# Patient Record
Sex: Female | Born: 1990
Health system: Southern US, Community
[De-identification: ages and names within clinical notes are randomized; demographics above are authoritative.]

## PROBLEM LIST (undated history)

## (undated) DIAGNOSIS — N200 Calculus of kidney: Secondary | ICD-10-CM

## (undated) DIAGNOSIS — N946 Dysmenorrhea, unspecified: Secondary | ICD-10-CM

## (undated) DIAGNOSIS — N920 Excessive and frequent menstruation with regular cycle: Secondary | ICD-10-CM

## (undated) DIAGNOSIS — F419 Anxiety disorder, unspecified: Secondary | ICD-10-CM

## (undated) DIAGNOSIS — F32A Depression, unspecified: Secondary | ICD-10-CM

## (undated) DIAGNOSIS — F329 Major depressive disorder, single episode, unspecified: Secondary | ICD-10-CM

## (undated) HISTORY — DX: Depression, unspecified: F32.A

## (undated) HISTORY — DX: Major depressive disorder, single episode, unspecified: F32.9

## (undated) HISTORY — DX: Dysmenorrhea, unspecified: N94.6

## (undated) HISTORY — DX: Excessive and frequent menstruation with regular cycle: N92.0

## (undated) HISTORY — DX: Anxiety disorder, unspecified: F41.9

## (undated) HISTORY — DX: Calculus of kidney: N20.0

---
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
## 2010-01-15 HISTORY — PX: TONSILLECTOMY AND ADENOIDECTOMY: SHX28

## 2011-01-05 ENCOUNTER — Ambulatory Visit: Payer: Self-pay | Admitting: Internal Medicine

## 2012-03-29 ENCOUNTER — Ambulatory Visit: Payer: Self-pay | Admitting: Emergency Medicine

## 2013-07-10 LAB — HM PAP SMEAR: HM Pap smear: NEGATIVE

## 2013-08-18 ENCOUNTER — Ambulatory Visit: Payer: Self-pay | Admitting: Oncology

## 2013-09-15 ENCOUNTER — Ambulatory Visit: Payer: Self-pay | Admitting: Oncology

## 2014-03-18 ENCOUNTER — Ambulatory Visit: Payer: Self-pay | Admitting: Internal Medicine

## 2014-03-21 ENCOUNTER — Emergency Department: Payer: Self-pay | Admitting: Emergency Medicine

## 2014-06-17 ENCOUNTER — Telehealth: Payer: Self-pay | Admitting: Obstetrics and Gynecology

## 2014-06-17 ENCOUNTER — Other Ambulatory Visit: Payer: Self-pay | Admitting: Obstetrics and Gynecology

## 2014-06-17 MED ORDER — ETONOGESTREL-ETHINYL ESTRADIOL 0.12-0.015 MG/24HR VA RING: VAGINAL_RING | VAGINAL | Status: DC

## 2014-06-17 NOTE — Telephone Encounter (Signed)
Pt called and would like a reffil on her RX..she needs a refill on her BC nuva ring..she was given a sample and liked and was told to call in if she wanted a refill..she uses the [facility] [place]..number [phone]...thansk tina

## 2014-06-17 NOTE — Telephone Encounter (Signed)
Done-ac 

## 2014-06-17 NOTE — Telephone Encounter (Signed)
Please let her know a prescription has been sent in, may want to check online for discount coupon for the Nuvaring

## 2014-06-21 ENCOUNTER — Telehealth: Payer: Self-pay | Admitting: Obstetrics and Gynecology

## 2014-06-22 ENCOUNTER — Other Ambulatory Visit: Payer: Self-pay | Admitting: Obstetrics and Gynecology

## 2014-06-22 NOTE — Telephone Encounter (Signed)
Please let her know a years rx was sent in

## 2014-06-22 NOTE — Telephone Encounter (Signed)
Called pt and notified that rx was sent to her pharmacy

## 2014-06-24 MED ORDER — ETONOGESTREL-ETHINYL ESTRADIOL 0.12-0.015 MG/24HR VA RING: VAGINAL_RING | VAGINAL | Status: DC

## 2014-06-24 NOTE — Telephone Encounter (Signed)
Pt called and stated Nuvaring was not at pharmacy.  Advised pt we will resend RX to pharmacy. RX resent to [employer] [facility]

## 2014-07-12 ENCOUNTER — Encounter: Payer: Self-pay | Admitting: *Deleted

## 2014-07-16 ENCOUNTER — Ambulatory Visit (INDEPENDENT_AMBULATORY_CARE_PROVIDER_SITE_OTHER): Payer: BC Managed Care – PPO | Admitting: Obstetrics and Gynecology

## 2014-07-16 ENCOUNTER — Encounter: Payer: Self-pay | Admitting: Obstetrics and Gynecology

## 2014-07-16 VITALS — BP 73/51 | HR 67 | Ht 66.0 in | Wt 191.4 lb

## 2014-07-16 DIAGNOSIS — R5383 Other fatigue: Secondary | ICD-10-CM

## 2014-07-16 DIAGNOSIS — F32A Depression, unspecified: Secondary | ICD-10-CM

## 2014-07-16 DIAGNOSIS — F418 Other specified anxiety disorders: Secondary | ICD-10-CM

## 2014-07-16 DIAGNOSIS — Z832 Family history of diseases of the blood and blood-forming organs and certain disorders involving the immune mechanism: Secondary | ICD-10-CM

## 2014-07-16 DIAGNOSIS — Z9889 Other specified postprocedural states: Secondary | ICD-10-CM | POA: Diagnosis not present

## 2014-07-16 DIAGNOSIS — F419 Anxiety disorder, unspecified: Principal | ICD-10-CM

## 2014-07-16 DIAGNOSIS — F329 Major depressive disorder, single episode, unspecified: Secondary | ICD-10-CM | POA: Diagnosis not present

## 2014-07-16 MED ORDER — BUPROPION HCL ER (XL) 150 MG PO TB24: 150.0000 mg | ORAL_TABLET | Freq: Every day | ORAL | Status: DC

## 2014-07-16 NOTE — Progress Notes (Signed)
Subjective:     Patient ID: Hinda Glatterassie Victoria Vinluan, female   DOB: 05/22/1990, 24 y.o.   MRN: [id number]  HPI previously seen for contraceptive management and anxiety- here for f/u on new medications  Review of Systems Report doing well on Nuvaring and desires to continue use Reports feeling less anxious and no panic attacks since starting Zoloft, but feeling overly tired and sleeping all day, feels too melancholy and not happy with medication.    Objective:   Physical Exam A&O X 4; Drowsy Pelvic exam deferred    Assessment:     Anxiety Contraceptive survellience  Fatigue No diagnosis of factor V mutation in father Plan:     Stop zoloft, and change to wellbutrin 150mg  daily, recheck in 5 weeks Labs obtained to r/o vitamin deficiencies and factor V mutation.  Will call with results.  RTC in 5 weeks  Ethridge Sollenberger Ines BloomerBurr, [place]

## 2014-07-17 LAB — COMPREHENSIVE METABOLIC PANEL
ALK PHOS: 38 IU/L — AB (ref 39–117)
ALT: 34 IU/L — ABNORMAL HIGH (ref 0–32)
AST: 30 IU/L (ref 0–40)
Albumin/Globulin Ratio: 2.3 (ref 1.1–2.5)
Albumin: 4.4 g/dL (ref 3.5–5.5)
BUN/Creatinine Ratio: 15 (ref 8–20)
BUN: 10 mg/dL (ref 6–20)
Bilirubin Total: 0.4 mg/dL (ref 0.0–1.2)
CHLORIDE: 103 mmol/L (ref 97–108)
CO2: 20 mmol/L (ref 18–29)
Calcium: 9.4 mg/dL (ref 8.7–10.2)
Creatinine, Ser: 0.68 mg/dL (ref 0.57–1.00)
GFR calc Af Amer: 142 mL/min/{1.73_m2} (ref >59)
GFR calc non Af Amer: 123 mL/min/{1.73_m2} (ref >59)
Globulin, Total: 1.9 g/dL (ref 1.5–4.5)
Glucose: 86 mg/dL (ref 65–99)
POTASSIUM: 4.5 mmol/L (ref 3.5–5.2)
SODIUM: 139 mmol/L (ref 134–144)
TOTAL PROTEIN: 6.3 g/dL (ref 6.0–8.5)

## 2014-07-17 LAB — THYROID PANEL WITH TSH
Free Thyroxine Index: 2.8 (ref 1.2–4.9)
T3 Uptake Ratio: 25 % (ref 24–39)
T4 TOTAL: 11 ug/dL (ref 4.5–12.0)
TSH: 1.89 u[IU]/mL (ref 0.450–4.500)

## 2014-07-17 LAB — CBC
HEMOGLOBIN: 14.3 g/dL (ref 11.1–15.9)
Hematocrit: 43 % (ref 34.0–46.6)
MCH: 34.3 pg — ABNORMAL HIGH (ref 26.6–33.0)
MCHC: 33.3 g/dL (ref 31.5–35.7)
MCV: 103 fL — AB (ref 79–97)
PLATELETS: 234 10*3/uL (ref 150–379)
RBC: 4.17 x10E6/uL (ref 3.77–5.28)
RDW: 12.4 % (ref 12.3–15.4)
WBC: 5.3 10*3/uL (ref 3.4–10.8)

## 2014-07-17 LAB — VITAMIN B12: VITAMIN B 12: 249 pg/mL (ref 211–946)

## 2014-07-17 LAB — FERRITIN: FERRITIN: 53 ng/mL (ref 15–150)

## 2014-07-22 LAB — FACTOR 5 LEIDEN

## 2014-07-22 LAB — VITAMIN D 1,25 DIHYDROXY
VITAMIN D 1, 25 (OH) TOTAL: 66 pg/mL
Vitamin D3 1, 25 (OH)2: 66 pg/mL

## 2014-08-13 ENCOUNTER — Other Ambulatory Visit: Payer: Self-pay | Admitting: *Deleted

## 2014-08-13 ENCOUNTER — Telehealth: Payer: Self-pay | Admitting: Obstetrics and Gynecology

## 2014-08-13 MED ORDER — BUPROPION HCL ER (XL) 150 MG PO TB24: 150.0000 mg | ORAL_TABLET | Freq: Every day | ORAL | Status: DC

## 2014-08-13 NOTE — Telephone Encounter (Signed)
Medication refilled pt has reck appt 08/20/2014

## 2014-08-13 NOTE — Telephone Encounter (Signed)
Tiffany Wilkerson has a f/u on 8/5 and is going to run out of med before she comes. Can you just sent 3 pills until then or what do you want her to do since that's what the appt is for.

## 2014-08-20 ENCOUNTER — Ambulatory Visit: Payer: BC Managed Care – PPO | Admitting: Obstetrics and Gynecology

## 2014-09-03 ENCOUNTER — Encounter: Payer: Self-pay | Admitting: Obstetrics and Gynecology

## 2014-09-03 ENCOUNTER — Ambulatory Visit (INDEPENDENT_AMBULATORY_CARE_PROVIDER_SITE_OTHER): Payer: BC Managed Care – PPO | Admitting: Obstetrics and Gynecology

## 2014-09-03 VITALS — BP 114/83 | HR 81 | Ht 66.0 in | Wt 187.0 lb

## 2014-09-03 DIAGNOSIS — F32A Depression, unspecified: Secondary | ICD-10-CM

## 2014-09-03 DIAGNOSIS — F329 Major depressive disorder, single episode, unspecified: Secondary | ICD-10-CM | POA: Diagnosis not present

## 2014-09-03 NOTE — Progress Notes (Signed)
Subjective:     Patient ID: Tiffany Wilkerson, female   DOB: 1990-09-23, 24 y.o.   MRN: [id number]  HPI Here for SNRI follow-up.  Review of Systems States '180%' improvement in symptoms, and has more energy. Happy with switch to Wellbutrin.  Does report feeling frustrated with inability to stay focused or complete task; boyfriend says she constantly spaces out or seems distracted. She states she has always felt that way but now that she is feeling better it is more noticeable.    Objective:   Physical Exam A&O x4 Well groomed female in no distress.     Assessment:     Depression, under good control with SNRI Possible ADD     Plan:     To continue on wellbutrin at current dose. Refer to [facility] for evaluation of ADD, will f/u after that apppointment to see if treatment needed.  Asencion Loveday Ines Bloomer, CNM

## 2014-09-03 NOTE — Patient Instructions (Signed)

## 2014-09-08 ENCOUNTER — Ambulatory Visit: Payer: BC Managed Care – PPO | Admitting: Obstetrics and Gynecology

## 2014-10-26 ENCOUNTER — Encounter: Payer: Self-pay | Admitting: Obstetrics and Gynecology

## 2014-10-26 ENCOUNTER — Other Ambulatory Visit: Payer: BC Managed Care – PPO

## 2014-10-26 ENCOUNTER — Other Ambulatory Visit: Payer: Self-pay | Admitting: Obstetrics and Gynecology

## 2014-10-27 LAB — BETA HCG QUANT (REF LAB)

## 2014-11-08 ENCOUNTER — Encounter: Payer: Self-pay | Admitting: Obstetrics and Gynecology

## 2014-11-19 ENCOUNTER — Encounter: Payer: Self-pay | Admitting: Obstetrics and Gynecology

## 2014-12-17 ENCOUNTER — Other Ambulatory Visit: Payer: Self-pay | Admitting: Obstetrics and Gynecology

## 2015-01-17 ENCOUNTER — Other Ambulatory Visit: Payer: Self-pay | Admitting: Obstetrics and Gynecology

## 2015-02-14 ENCOUNTER — Other Ambulatory Visit: Payer: Self-pay | Admitting: Obstetrics and Gynecology

## 2015-05-04 ENCOUNTER — Encounter: Payer: Self-pay | Admitting: Obstetrics and Gynecology

## 2015-05-05 ENCOUNTER — Encounter: Payer: Self-pay | Admitting: Obstetrics and Gynecology

## 2015-06-06 ENCOUNTER — Encounter: Payer: Self-pay | Admitting: Obstetrics and Gynecology

## 2015-06-16 ENCOUNTER — Other Ambulatory Visit: Payer: Self-pay | Admitting: Obstetrics and Gynecology

## 2015-06-24 IMAGING — CT CT ABD-PELV W/ CM
2 of 4 series · 16 of 46 positions shown, 18 images · IV contrast (omnipaque)
Comparison: None.

CLINICAL DATA: Initial evaluation abdominal pain for 2 weeks,
personal history of gastric bypass on August 2013

EXAM:
CT ABDOMEN AND PELVIS WITH CONTRAST
TECHNIQUE: Multidetector CT imaging of the abdomen and pelvis was performed
using the standard protocol following bolus administration of
intravenous contrast.
CONTRAST:  100 mL Omnipaque 300

[Series 2: routine abd pel with · axial · 0.67mm/px · z∈[-498,-68]mm · 13 of 96 slices shown, 15 images]
[im 5/96  soft-tissue]
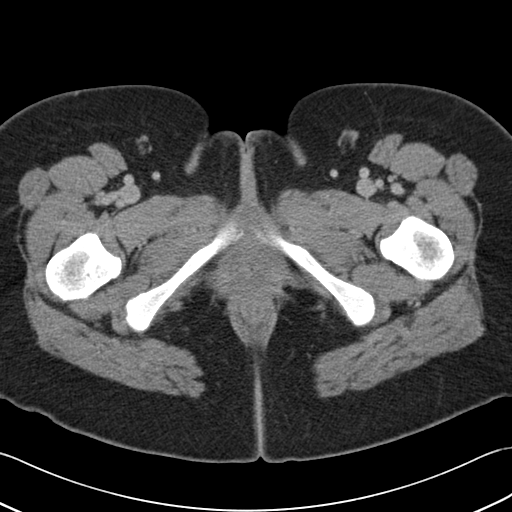
[im 5/96  bone]
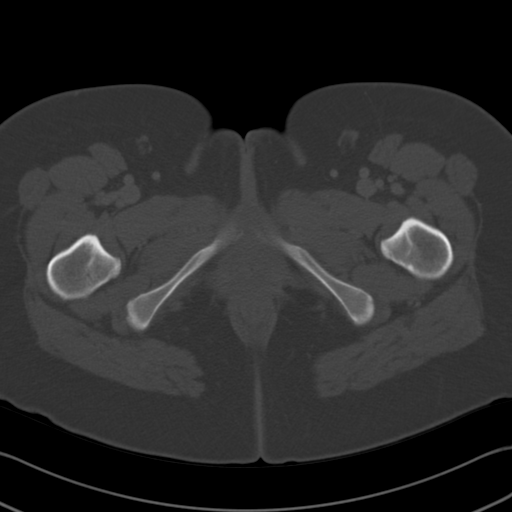
[im 13/96  soft-tissue]
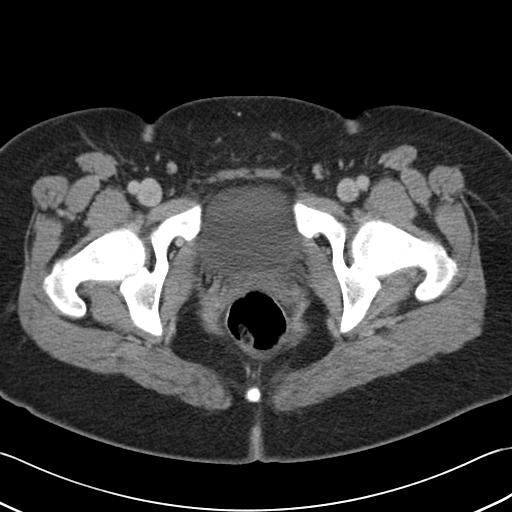
[im 21/96  soft-tissue]
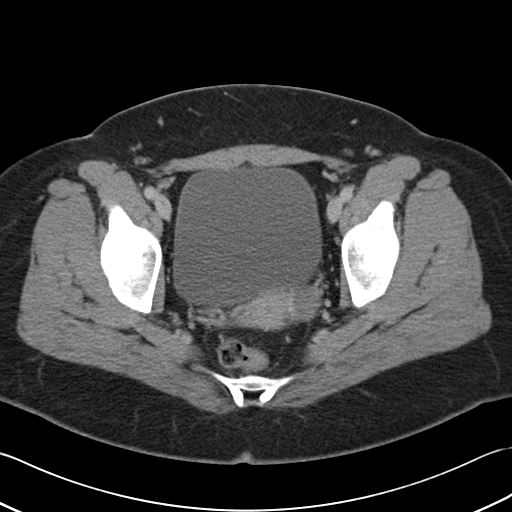
[im 25/96  soft-tissue]
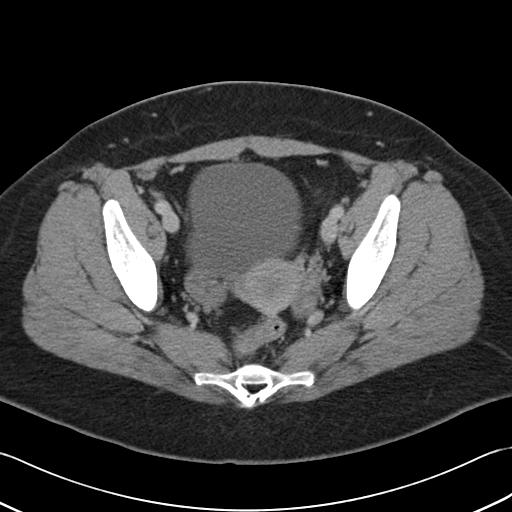
[im 34/96  soft-tissue]
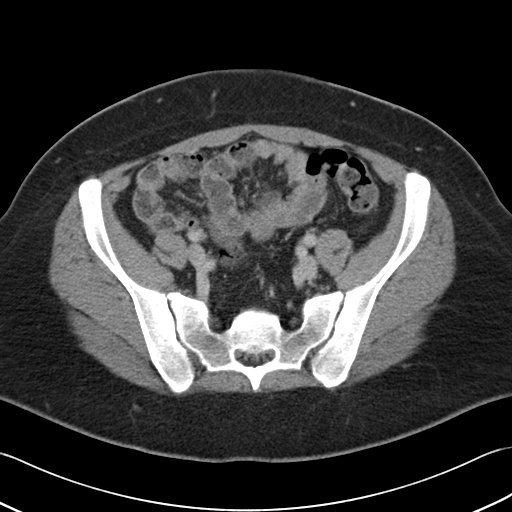
[im 42/96  soft-tissue]
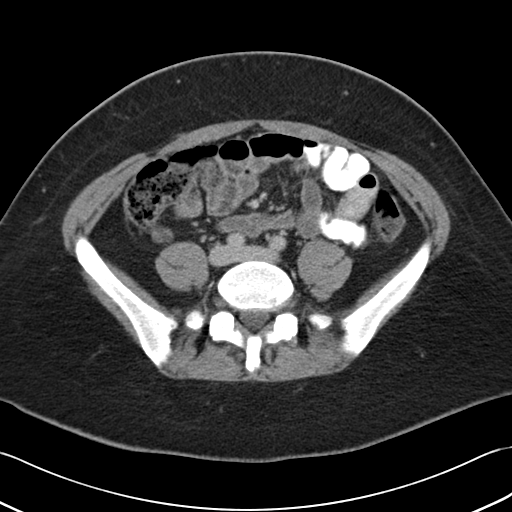
[im 50/96  soft-tissue]
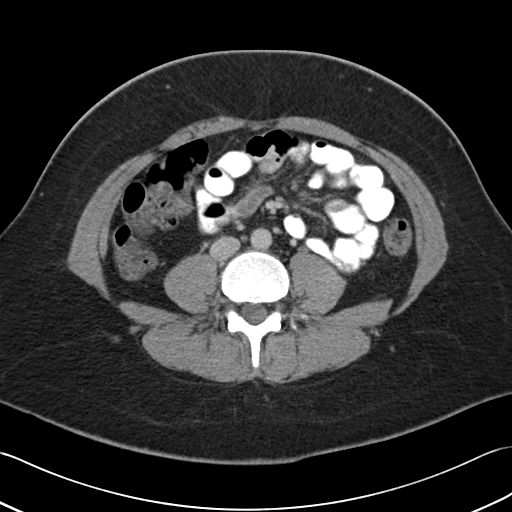
[im 54/96  soft-tissue]
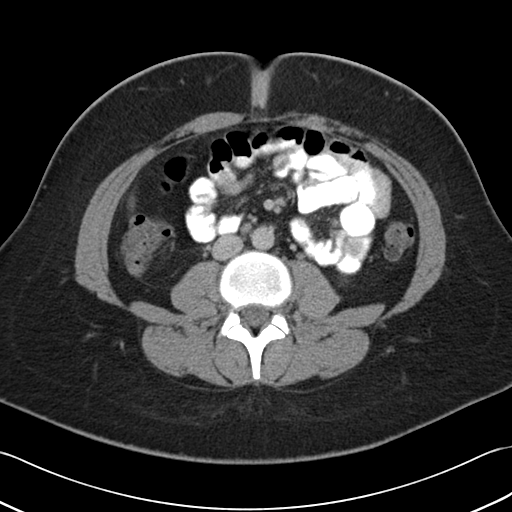
[im 62/96  soft-tissue]
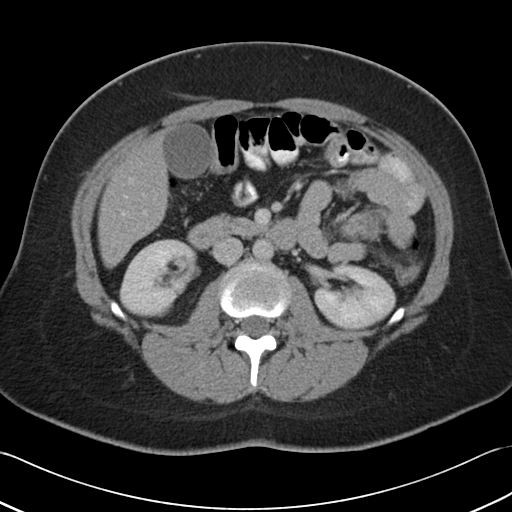
[im 62/96  bone]
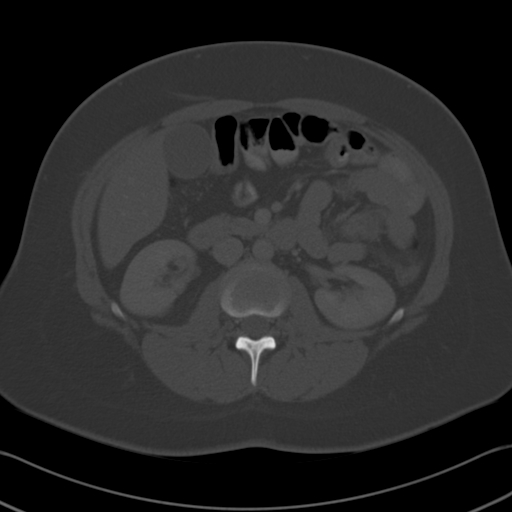
[im 71/96  soft-tissue]
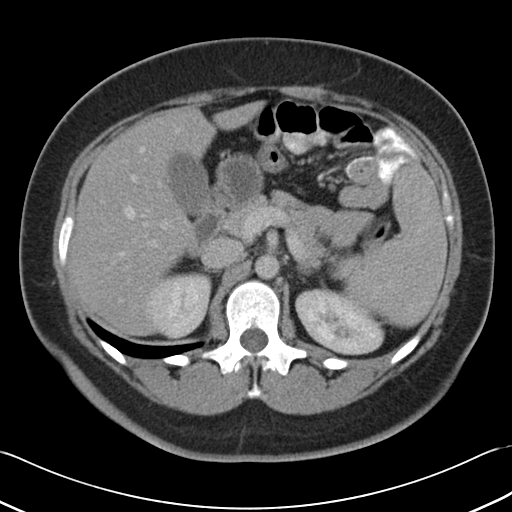
[im 75/96  soft-tissue]
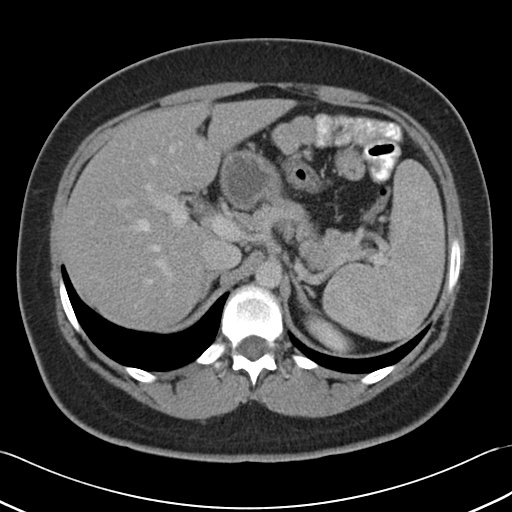
[im 83/96  soft-tissue]
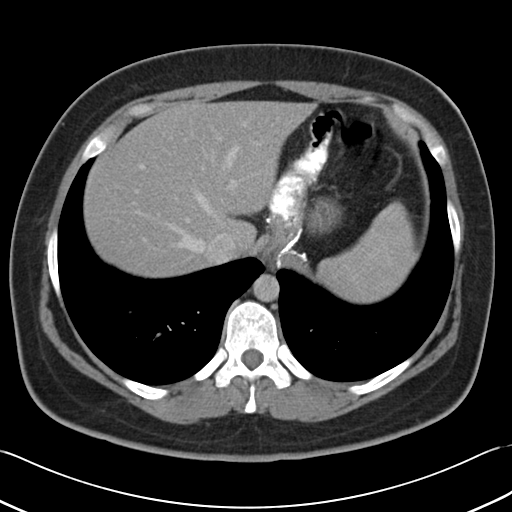
[im 91/96  soft-tissue]
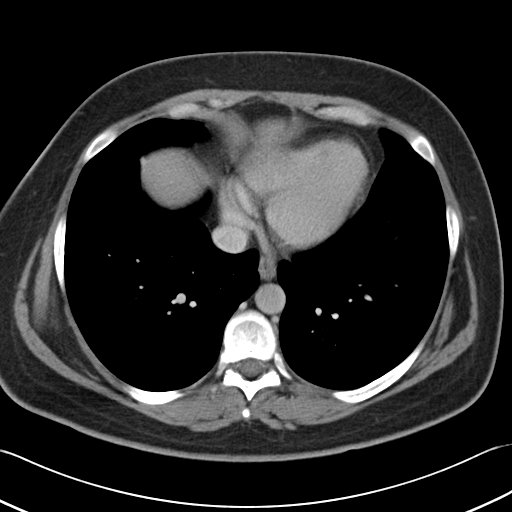

[Series 5: cor routine abd pel with · coronal · 0.62mm/px · 3 of 131 slices shown]
[im 44/131  soft-tissue]
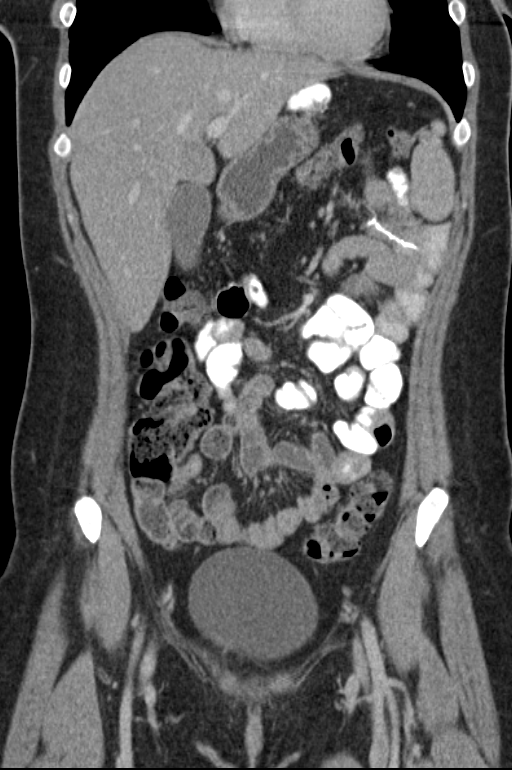
[im 58/131  soft-tissue]
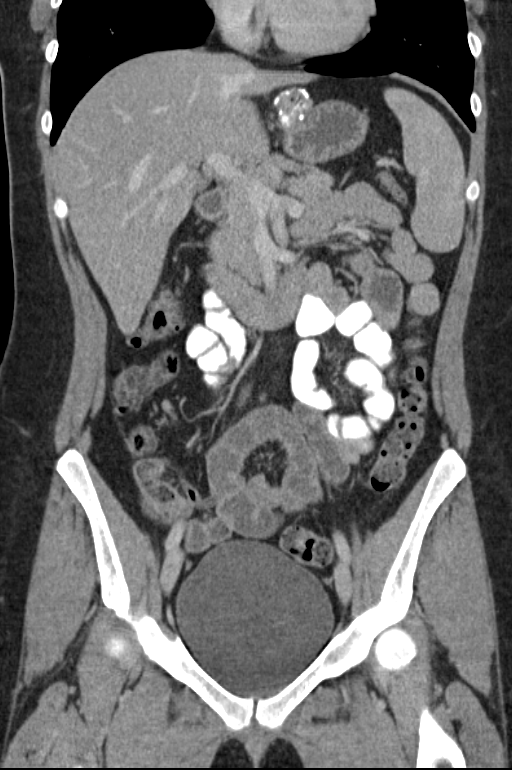
[im 73/131  soft-tissue]
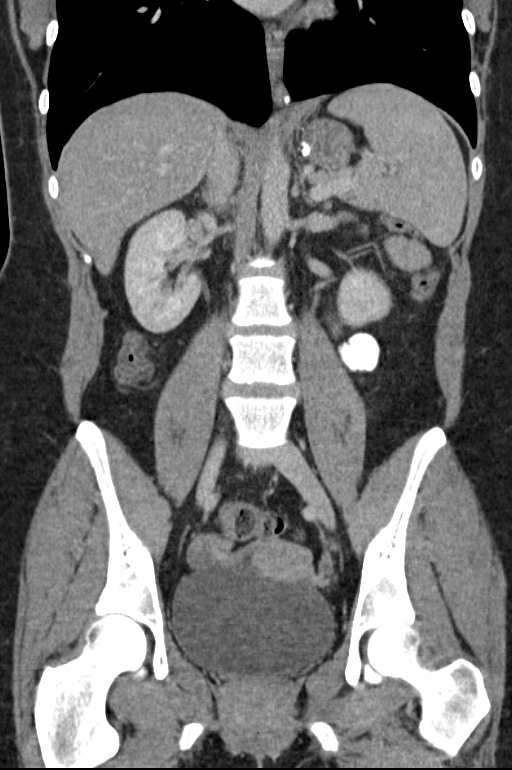

[16 of 46 positions shown; findings below may reference images not displayed]

FINDINGS: Mild to moderate hepatic steatosis. Spleen is normal. Pancreas is
normal. Adrenal glands are normal.

Surgical change consistent with gastric bypass.

Kidneys are normal. Small bowel is normal. Appendix is normal. Large
bowel is normal.

Bladder is normal. Reproductive organs are normal. There is no free
fluid. There is no significant adenopathy in the abdomen or pelvis.
Abdominal aorta is normal.

Laterally in the right lower lobe there are 2 pulmonary nodules both
measuring about 3 mm. Posteriorly in the left lung base there is a 3
mm pulmonary nodule.

There are no acute musculoskeletal findings.
IMPRESSION: No acute abnormalities to explain the patient's symptoms.

Pulmonary nodules seen incidentally. If the patient is at high risk
for bronchogenic carcinoma, follow-up chest CT at 1 year is
recommended. If the patient is at low risk, no follow-up is needed.
This recommendation follows the consensus statement: Guidelines for
Management of Small Pulmonary Nodules Detected on CT Scans: A
Statement from the [HOSPITAL] as published in Radiology

## 2015-08-15 ENCOUNTER — Other Ambulatory Visit: Payer: Self-pay | Admitting: *Deleted

## 2015-08-15 DIAGNOSIS — R5383 Other fatigue: Secondary | ICD-10-CM

## 2015-08-16 ENCOUNTER — Other Ambulatory Visit: Payer: Self-pay

## 2015-08-16 ENCOUNTER — Other Ambulatory Visit: Payer: BC Managed Care – PPO

## 2015-08-16 DIAGNOSIS — R5383 Other fatigue: Secondary | ICD-10-CM

## 2015-08-18 LAB — COMPREHENSIVE METABOLIC PANEL
A/G RATIO: 2.1 (ref 1.2–2.2)
ALT: 19 IU/L (ref 0–32)
AST: 11 IU/L (ref 0–40)
Albumin: 4 g/dL (ref 3.5–5.5)
Alkaline Phosphatase: 33 IU/L — ABNORMAL LOW (ref 39–117)
BUN/Creatinine Ratio: 16 (ref 9–23)
BUN: 9 mg/dL (ref 6–20)
Bilirubin Total: 0.3 mg/dL (ref 0.0–1.2)
CALCIUM: 8.8 mg/dL (ref 8.7–10.2)
CO2: 24 mmol/L (ref 18–29)
CREATININE: 0.58 mg/dL (ref 0.57–1.00)
Chloride: 103 mmol/L (ref 96–106)
GFR, EST AFRICAN AMERICAN: 148 mL/min/{1.73_m2} (ref >59)
GFR, EST NON AFRICAN AMERICAN: 129 mL/min/{1.73_m2} (ref >59)
GLUCOSE: 63 mg/dL — AB (ref 65–99)
Globulin, Total: 1.9 g/dL (ref 1.5–4.5)
Potassium: 4.8 mmol/L (ref 3.5–5.2)
Sodium: 141 mmol/L (ref 134–144)
TOTAL PROTEIN: 5.9 g/dL — AB (ref 6.0–8.5)

## 2015-08-18 LAB — HEMOGLOBIN A1C
ESTIMATED AVERAGE GLUCOSE: 91 mg/dL
Hgb A1c MFr Bld: 4.8 % (ref 4.8–5.6)

## 2015-08-18 LAB — CBC
HEMATOCRIT: 39.1 % (ref 34.0–46.6)
HEMOGLOBIN: 12.6 g/dL (ref 11.1–15.9)
MCH: 27.8 pg (ref 26.6–33.0)
MCHC: 32.2 g/dL (ref 31.5–35.7)
MCV: 86 fL (ref 79–97)
PLATELETS: 271 10*3/uL (ref 150–379)
RBC: 4.53 x10E6/uL (ref 3.77–5.28)
RDW: 14.6 % (ref 12.3–15.4)
WBC: 6.9 10*3/uL (ref 3.4–10.8)

## 2015-08-18 LAB — IRON: IRON: 117 ug/dL (ref 27–159)

## 2015-08-18 LAB — THYROID PANEL WITH TSH
FREE THYROXINE INDEX: 0.9 — AB (ref 1.2–4.9)
T3 UPTAKE RATIO: 19 % — AB (ref 24–39)
T4, Total: 4.8 ug/dL (ref 4.5–12.0)
TSH: 1.85 u[IU]/mL (ref 0.450–4.500)

## 2015-08-18 LAB — VITAMIN D 25 HYDROXY (VIT D DEFICIENCY, FRACTURES): VIT D 25 HYDROXY: 9.8 ng/mL — AB (ref 30.0–100.0)

## 2015-08-22 ENCOUNTER — Other Ambulatory Visit: Payer: Self-pay | Admitting: Obstetrics and Gynecology

## 2015-08-22 ENCOUNTER — Telehealth: Payer: Self-pay | Admitting: *Deleted

## 2015-08-22 DIAGNOSIS — E559 Vitamin D deficiency, unspecified: Secondary | ICD-10-CM | POA: Insufficient documentation

## 2015-08-22 MED ORDER — VITAMIN D (ERGOCALCIFEROL) 1.25 MG (50000 UNIT) PO CAPS: 50000.0000 [IU] | ORAL_CAPSULE | ORAL | 2 refills | Status: AC

## 2015-08-22 NOTE — Telephone Encounter (Signed)
Notified pt and mailed info on Vit D

## 2015-08-25 ENCOUNTER — Ambulatory Visit: Payer: BC Managed Care – PPO | Admitting: Obstetrics and Gynecology

## 2015-09-22 ENCOUNTER — Encounter: Payer: BC Managed Care – PPO | Admitting: Obstetrics and Gynecology

## 2015-10-13 ENCOUNTER — Telehealth: Payer: Self-pay | Admitting: Obstetrics and Gynecology

## 2015-10-13 NOTE — Telephone Encounter (Signed)
Left detailed message for pt 

## 2015-10-13 NOTE — Telephone Encounter (Signed)
Issues with ins and medications got all screwed up, [facility] whole [facility], and she cant get her nuva ring until she gets her ins reinstated. Do we have a sample.

## 2015-10-21 MED FILL — DEXTROAMP-AMPHET ER 25 MG C: 25 | 30 days supply | Qty: 60 | Fill #0

## 2015-10-26 MED FILL — ADZENYS XR-ODT 15.7 MG TAB: 15.7 | 30 days supply | Qty: 60 | Fill #0

## 2016-01-15 DIAGNOSIS — F902 Attention-deficit hyperactivity disorder, combined type: Secondary | ICD-10-CM | POA: Insufficient documentation

## 2016-01-15 DIAGNOSIS — F419 Anxiety disorder, unspecified: Secondary | ICD-10-CM | POA: Insufficient documentation

## 2016-02-16 ENCOUNTER — Encounter: Payer: BC Managed Care – PPO | Admitting: Obstetrics and Gynecology

## 2017-05-09 ENCOUNTER — Encounter: Payer: BC Managed Care – PPO | Admitting: Obstetrics and Gynecology

## 2017-07-12 ENCOUNTER — Encounter: Payer: BC Managed Care – PPO | Admitting: Obstetrics and Gynecology

## 2017-08-05 ENCOUNTER — Encounter: Payer: Self-pay | Admitting: Emergency Medicine

## 2017-08-05 ENCOUNTER — Emergency Department
Admission: EM | Admit: 2017-08-05 | Discharge: 2017-08-05 | Disposition: A | Discharge status: Home / Self Care | Payer: BC Managed Care – PPO | Attending: Emergency Medicine | Admitting: Emergency Medicine

## 2017-08-05 DIAGNOSIS — F17200 Nicotine dependence, unspecified, uncomplicated: Secondary | ICD-10-CM | POA: Diagnosis not present

## 2017-08-05 DIAGNOSIS — K920 Hematemesis: Secondary | ICD-10-CM | POA: Diagnosis not present

## 2017-08-05 DIAGNOSIS — R1110 Vomiting, unspecified: Secondary | ICD-10-CM | POA: Diagnosis present

## 2017-08-05 DIAGNOSIS — Z79899 Other long term (current) drug therapy: Secondary | ICD-10-CM | POA: Insufficient documentation

## 2017-08-05 LAB — COMPREHENSIVE METABOLIC PANEL
ALK PHOS: 43 U/L (ref 38–126)
ALT: 18 U/L (ref 0–44)
AST: 20 U/L (ref 15–41)
Albumin: 4.2 g/dL (ref 3.5–5.0)
Anion gap: 7 (ref 5–15)
BILIRUBIN TOTAL: 0.5 mg/dL (ref 0.3–1.2)
BUN: 17 mg/dL (ref 6–20)
CALCIUM: 8.4 mg/dL — AB (ref 8.9–10.3)
CO2: 26 mmol/L (ref 22–32)
CREATININE: 0.63 mg/dL (ref 0.44–1.00)
Chloride: 106 mmol/L (ref 98–111)
Glucose, Bld: 99 mg/dL (ref 70–99)
Potassium: 4.5 mmol/L (ref 3.5–5.1)
SODIUM: 139 mmol/L (ref 135–145)
TOTAL PROTEIN: 7 g/dL (ref 6.5–8.1)

## 2017-08-05 LAB — CBC
HCT: 35.9 % (ref 35.0–47.0)
Hemoglobin: 12 g/dL (ref 12.0–16.0)
MCH: 29.9 pg (ref 26.0–34.0)
MCHC: 33.4 g/dL (ref 32.0–36.0)
MCV: 89.5 fL (ref 80.0–100.0)
PLATELETS: 232 10*3/uL (ref 150–440)
RBC: 4.01 MIL/uL (ref 3.80–5.20)
RDW: 18.1 % — ABNORMAL HIGH (ref 11.5–14.5)
WBC: 6.3 10*3/uL (ref 3.6–11.0)

## 2017-08-05 LAB — URINALYSIS, COMPLETE (UACMP) WITH MICROSCOPIC
Bilirubin Urine: NEGATIVE
GLUCOSE, UA: NEGATIVE mg/dL
Ketones, ur: NEGATIVE mg/dL
Leukocytes, UA: NEGATIVE
Nitrite: NEGATIVE
PH: 6 (ref 5.0–8.0)
Protein, ur: NEGATIVE mg/dL
Specific Gravity, Urine: 1.018 (ref 1.005–1.030)

## 2017-08-05 LAB — LIPASE, BLOOD: LIPASE: 32 U/L (ref 11–51)

## 2017-08-05 LAB — POCT PREGNANCY, URINE: Preg Test, Ur: NEGATIVE

## 2017-08-05 MED ORDER — PANTOPRAZOLE SODIUM 40 MG PO TBEC: 40.0000 mg | DELAYED_RELEASE_TABLET | Freq: Every day | ORAL | 1 refills | Status: AC

## 2017-08-05 NOTE — ED Provider Notes (Signed)
[facility] Emergency Department Provider Note   ____________________________________________   I have reviewed the triage vital signs and the nursing notes.   HISTORY  Chief Complaint Hematemesis   History limited by: Not Limited   HPI Tiffany Wilkerson is a 27 y.o. female who presents to the emergency department today because of concerns for vomiting blood.  The patient states that she had one episode 2 days ago.  It was bright red.  Had another episode this morning.  She denies any associated abdominal pain.  Does get nauseous and has had vomiting in the past although denies any blood in the past.  She has not noticed any black or tarry or bloody stool.  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  She states she has had no complications since then.   Per medical record review patient has a history of gastric bypass  Past Medical History:  Diagnosis Date  . Anxiety   . Depression   . Heavy periods   . Kidney stones   . Painful menstrual periods     Patient Active Problem List   Diagnosis Date Noted  . 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 TONSILLECTOMY AND ADENOIDECTOMY  2012    Prior to Admission medications   Medication Sig Start Date End Date Taking? Authorizing Provider  amphetamine-dextroamphetamine (ADDERALL XR) 25 MG 24 hr capsule Take 50 mg by mouth daily. 03/28/15 08/12/17 Yes [provider]  amphetamine-dextroamphetamine (ADDERALL) 20 MG tablet Take 20 mg by mouth daily as needed. Only takes in the afternoon if needed. 07/13/17 10/11/17 Yes [provider]  FLUoxetine (PROZAC) 20 MG tablet Take 20 mg by mouth daily. 04/19/17 04/19/18 Yes [provider]  NUVARING 0.12-0.015 MG/24HR vaginal ring INSERT 1 RING VAGINALLY FOR 3 WEEKS THEN REMOVE FOR 1 WEEK Patient not taking: Reported on 08/05/2017 06/16/15   Shambley, Melody N, CNM  Vitamin D,  Ergocalciferol, (DRISDOL) 50000 units CAPS capsule Take 1 capsule (50,000 Units total) by mouth 2 (two) times a week. Patient not taking: Reported on 08/05/2017 08/22/15   Purcell NailsShambley, Melody N, CNM    Allergies Patient has no known allergies.  Family History  Problem Relation Age of Onset  . Diabetes Father     Social History Social History   Tobacco Use  . Smoking status: Light Tobacco Smoker  . Smokeless tobacco: Never Used  Substance Use Topics  . Alcohol use: Yes    Comment: moderate  . Drug use: No    Review of Systems Constitutional: No fever/chills Eyes: No visual changes. ENT: No sore throat. Cardiovascular: Denies chest pain. Respiratory: Denies shortness of breath. Gastrointestinal: No abdominal pain.  Positive for bloody emesis.  Genitourinary: Negative for dysuria. Musculoskeletal: Negative for back pain. Skin: Negative for rash. Neurological: Negative for headaches, focal weakness or numbness.  ____________________________________________   PHYSICAL EXAM:  VITAL SIGNS: ED Triage Vitals  Enc Vitals Group     BP 08/05/17 0917 (!) 116/94     Pulse Rate 08/05/17 0917 91     Resp 08/05/17 0917 18     Temp 08/05/17 0917 98.7 F (37.1 C)     Temp Source 08/05/17 0917 Oral     SpO2 08/05/17 0917 99 %     Weight 08/05/17 0918 200 lb (90.7 kg)     Height 08/05/17 0918 5\' 6"  (1.676 m)     Head Circumference --  Peak Flow --      Pain Score 08/05/17 0918 0   Constitutional: Alert and oriented.  Eyes: Conjunctivae are normal.  ENT      Head: Normocephalic and atraumatic.      Nose: No congestion/rhinnorhea.      Mouth/Throat: Mucous membranes are moist.      Neck: No stridor. Hematological/Lymphatic/Immunilogical: No cervical lymphadenopathy. Cardiovascular: Normal rate, regular rhythm.  No murmurs, rubs, or gallops.  Respiratory: Normal respiratory effort without tachypnea nor retractions. Breath sounds are clear and equal bilaterally. No  wheezes/rales/rhonchi. Gastrointestinal: Soft and non tender. No rebound. No guarding.  Genitourinary: Deferred Musculoskeletal: Normal range of motion in all extremities. No lower extremity edema. Neurologic:  Normal speech and language. No gross focal neurologic deficits are appreciated.  Skin:  Skin is warm, dry and intact. No rash noted. Psychiatric: Mood and affect are normal. Speech and behavior are normal. Patient exhibits appropriate insight and judgment.  ____________________________________________    LABS (pertinent positives/negatives)  Upreg negative UA hazy, large urine dipstick, 0-5 RBC and WBC Lipase 32 CMP wnl except ca 8.4 CBC wbc 6.3, hgb 12.0, plt 232 ____________________________________________   EKG  None  ____________________________________________    RADIOLOGY  None  ____________________________________________   PROCEDURES  Procedures  ____________________________________________   INITIAL IMPRESSION / ASSESSMENT AND PLAN / ED COURSE  Pertinent labs & imaging results that were available during my care of the patient were reviewed by me and considered in my medical decision making (see chart for details).   Patient presented to the emergency department today because of concern for bloody vomit. Has had two episodes. Initial CBC without anemia. Did discuss with patient multiple work up options. She was not interested in admission. Did offer to watch in the emergency department and repeat hemoglobin, however patient chose to leave before repeat hemoglobin could be sent. Will start on protonix. Will give GI follow up.  ____________________________________________   FINAL CLINICAL IMPRESSION(S) / ED DIAGNOSES  Final diagnoses:  Hematemesis, presence of nausea not specified     Note: This dictation was prepared with Dragon dictation. Any transcriptional errors that result from this process are unintentional     Phineas Semen,  MD 08/05/17 1239

## 2017-08-05 NOTE — ED Notes (Signed)
.   Pt is resting, Respirations even and unlabored, NAD. Stretcher lowest postion and locked. Call bell within reach. Denies any needs at this time RN will continue to monitor.    

## 2017-08-05 NOTE — ED Triage Notes (Signed)
Patient presents to the ED after vomiting bright red blood this morning.  Patient states she had a similar episode on Saturday.  Patient denies any dark red or black emesis.  Patient denies dark stools.  Patient denies abdominal pain.  Patient reports history of gastric bypass.

## 2017-08-05 NOTE — ED Notes (Addendum)
Pt presents with emesis x2 that started this morning with slightly bright red blood. Pt has a gastric bypass surgery in hx.   Pt is A/Ox4 communicating with friend at bedside. Pt is awaiting EDP.

## 2017-08-05 NOTE — ED Notes (Signed)
First Nurse:  Patient states she has vomited X 3 since Sat. States she has bright red blood.  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

## 2017-08-05 NOTE — ED Notes (Signed)
Computer on down time currently. Pt verbalized discharge instructions and has no questions at this time. Pt ambulatory with steady gait.

## 2017-08-05 NOTE — Discharge Instructions (Addendum)
Please seek medical attention for any high fevers, chest pain, shortness of breath, change in behavior, persistent vomiting, bloody stool or any other new or concerning symptoms.  

## 2017-08-13 ENCOUNTER — Other Ambulatory Visit: Payer: Self-pay

## 2017-08-13 ENCOUNTER — Ambulatory Visit: Payer: BC Managed Care – PPO | Admitting: Gastroenterology

## 2017-08-13 NOTE — Progress Notes (Deleted)
She  has a prior history of gastric bypass and was seen at the Er on 08/05/17 with a complaint of throwing up blood. H/o gastric bypass   CBC Latest Ref Rng & Units 08/05/2017 08/16/2015 07/16/2014  WBC 3.6 - 11.0 K/uL 6.3 6.9 5.3  Hemoglobin 12.0 - 16.0 g/dL 28.412.0 13.212.6 44.014.3  Hematocrit 35.0 - 47.0 % 35.9 39.1 43.0  Platelets 150 - 440 K/uL 232 271 234   Been treated with PPI. Since her ER visit she says that ****   Here to see me today for 1 episode of hematemesis with normal HB. Explained causes can range from esophagitis to a mallory weiss tear.   Plan  1. EGD ***

## 2017-08-22 ENCOUNTER — Ambulatory Visit: Payer: BC Managed Care – PPO | Admitting: Gastroenterology

## 2017-08-22 ENCOUNTER — Encounter: Payer: Self-pay | Admitting: Gastroenterology

## 2017-11-01 ENCOUNTER — Encounter: Payer: BC Managed Care – PPO | Admitting: Obstetrics and Gynecology

## 2017-11-05 ENCOUNTER — Encounter: Payer: BC Managed Care – PPO | Admitting: Obstetrics and Gynecology

## 2020-01-27 ENCOUNTER — Other Ambulatory Visit: Payer: BC Managed Care – PPO

## 2020-01-29 ENCOUNTER — Other Ambulatory Visit: Payer: Self-pay

## 2020-08-26 ENCOUNTER — Ambulatory Visit: Payer: BC Managed Care – PPO | Admitting: Family Medicine

## 2020-08-26 DIAGNOSIS — D689 Coagulation defect, unspecified: Secondary | ICD-10-CM | POA: Insufficient documentation
